# Patient Record
Sex: Female | Born: 1975 | Hispanic: Yes | Marital: Single | Smoking: Never smoker
Health system: Southern US, Community
[De-identification: ages and names within clinical notes are randomized; demographics above are authoritative.]

## PROBLEM LIST (undated history)

## (undated) DIAGNOSIS — E039 Hypothyroidism, unspecified: Secondary | ICD-10-CM

## (undated) DIAGNOSIS — E079 Disorder of thyroid, unspecified: Secondary | ICD-10-CM

---

## 2007-05-02 ENCOUNTER — Ambulatory Visit: Payer: Self-pay

## 2008-06-14 ENCOUNTER — Inpatient Hospital Stay: Payer: Self-pay | Admitting: Internal Medicine

## 2012-12-05 DIAGNOSIS — E039 Hypothyroidism, unspecified: Secondary | ICD-10-CM | POA: Insufficient documentation

## 2013-12-06 DIAGNOSIS — IMO0002 Reserved for concepts with insufficient information to code with codable children: Secondary | ICD-10-CM | POA: Insufficient documentation

## 2013-12-06 DIAGNOSIS — O09529 Supervision of elderly multigravida, unspecified trimester: Secondary | ICD-10-CM | POA: Insufficient documentation

## 2013-12-13 DIAGNOSIS — O09299 Supervision of pregnancy with other poor reproductive or obstetric history, unspecified trimester: Secondary | ICD-10-CM

## 2013-12-13 HISTORY — DX: Supervision of pregnancy with other poor reproductive or obstetric history, unspecified trimester: O09.299

## 2020-03-30 ENCOUNTER — Other Ambulatory Visit: Payer: Self-pay

## 2020-03-30 ENCOUNTER — Emergency Department
Admission: EM | Admit: 2020-03-30 | Discharge: 2020-03-30 | Disposition: A | Discharge status: Home / Self Care | Payer: Self-pay | Attending: Emergency Medicine | Admitting: Emergency Medicine

## 2020-03-30 ENCOUNTER — Emergency Department: Payer: Self-pay

## 2020-03-30 ENCOUNTER — Encounter: Payer: Self-pay | Admitting: Emergency Medicine

## 2020-03-30 DIAGNOSIS — K5792 Diverticulitis of intestine, part unspecified, without perforation or abscess without bleeding: Secondary | ICD-10-CM | POA: Insufficient documentation

## 2020-03-30 HISTORY — DX: Disorder of thyroid, unspecified: E07.9

## 2020-03-30 LAB — BASIC METABOLIC PANEL
Anion gap: 10 (ref 5–15)
BUN: 8 mg/dL (ref 6–20)
CO2: 25 mmol/L (ref 22–32)
Calcium: 8.6 mg/dL — ABNORMAL LOW (ref 8.9–10.3)
Chloride: 103 mmol/L (ref 98–111)
Creatinine, Ser: 0.58 mg/dL (ref 0.44–1.00)
GFR calc Af Amer: >60 mL/min (ref >60)
GFR calc non Af Amer: >60 mL/min (ref >60)
Glucose, Bld: 101 mg/dL — ABNORMAL HIGH (ref 70–99)
Potassium: 3.5 mmol/L (ref 3.5–5.1)
Sodium: 138 mmol/L (ref 135–145)

## 2020-03-30 LAB — URINALYSIS, COMPLETE (UACMP) WITH MICROSCOPIC
Bilirubin Urine: NEGATIVE
Glucose, UA: NEGATIVE mg/dL
Hgb urine dipstick: NEGATIVE
Ketones, ur: NEGATIVE mg/dL
Leukocytes,Ua: NEGATIVE
Nitrite: NEGATIVE
Protein, ur: NEGATIVE mg/dL
Specific Gravity, Urine: 1.01 (ref 1.005–1.030)
pH: 7 (ref 5.0–8.0)

## 2020-03-30 LAB — POCT PREGNANCY, URINE: Preg Test, Ur: NEGATIVE

## 2020-03-30 LAB — CBC
HCT: 36.8 % (ref 36.0–46.0)
Hemoglobin: 13.3 g/dL (ref 12.0–15.0)
MCH: 31.3 pg (ref 26.0–34.0)
MCHC: 36.1 g/dL — ABNORMAL HIGH (ref 30.0–36.0)
MCV: 86.6 fL (ref 80.0–100.0)
Platelets: 328 10*3/uL (ref 150–400)
RBC: 4.25 MIL/uL (ref 3.87–5.11)
RDW: 12.9 % (ref 11.5–15.5)
WBC: 13.3 10*3/uL — ABNORMAL HIGH (ref 4.0–10.5)
nRBC: 0 % (ref 0.0–0.2)

## 2020-03-30 MED ORDER — HYDROCODONE-ACETAMINOPHEN 5-325 MG PO TABS: 1.0000 | ORAL_TABLET | ORAL | 0 refills | Status: DC | PRN

## 2020-03-30 MED ORDER — AMOXICILLIN-POT CLAVULANATE 875-125 MG PO TABS: 1.0000 | ORAL_TABLET | Freq: Two times a day (BID) | ORAL | 0 refills | Status: AC

## 2020-03-30 NOTE — ED Triage Notes (Signed)
Left flank and left side pain since last night. No NVD. No fever or hematuria. Denies hx of kidney stones.  Ambulatory, VSS

## 2020-03-30 NOTE — ED Provider Notes (Signed)
[facility] Emergency Department Provider Note  Time seen: 12:18 PM  I have reviewed the triage vital signs and the nursing notes.   HISTORY  Chief Complaint Abdominal Pain   HPI Emily Ellison is a 44 y.o. female with no significant past medical history presents emergency department for left flank pain.  According to the patient since last night she has been experiencing sharp 8/10 pain in her left side.  Denies any nausea vomiting diarrhea does state urinary frequency but denies dysuria or hematuria.  Denies any recent illnesses, largely negative review of systems.   Past Medical History:  Diagnosis Date  . Thyroid disease     There are no problems to display for this patient.   History reviewed. No pertinent surgical history.  Prior to Admission medications   Not on File    No Known Allergies  History reviewed. No pertinent family history.  Social History Social History   Tobacco Use  . Smoking status: Never Smoker  . Smokeless tobacco: Never Used  Substance Use Topics  . Alcohol use: Never  . Drug use: Never    Review of Systems Constitutional: Negative for fever. Cardiovascular: Negative for chest pain. Respiratory: Negative for shortness of breath. Gastrointestinal: Moderate left flank pain.  Negative for nausea vomiting diarrhea Genitourinary: Negative for urinary compaints Musculoskeletal: Negative for musculoskeletal complaints Neurological: Negative for headache All other ROS negative  ____________________________________________   PHYSICAL EXAM:  VITAL SIGNS: ED Triage Vitals  Enc Vitals Group     BP 03/30/20 1134 109/61     Pulse Rate 03/30/20 1134 77     Resp 03/30/20 1134 16     Temp 03/30/20 1134 98.5 F (36.9 C)     Temp Source 03/30/20 1134 Oral     SpO2 03/30/20 1134 96 %     Weight 03/30/20 1131 154 lb (69.9 kg)     Height 03/30/20 1131 5\' 5"  (1.651 m)     Head Circumference --      Peak Flow --       Pain Score 03/30/20 1131 8     Pain Loc --      Pain Edu? --      Excl. in GC? --    Constitutional: Alert and oriented. Well appearing and in no distress. Eyes: Normal exam ENT      Head: Normocephalic and atraumatic.      Mouth/Throat: Mucous membranes are moist. Cardiovascular: Normal rate, regular rhythm. Respiratory: Normal respiratory effort without tachypnea nor retractions. Breath sounds are clear  Gastrointestinal: Soft, mild left upper and left lower quadrant tenderness palpation without rebound guarding or distention. Musculoskeletal: Nontender with normal range of motion in all extremities.  Neurologic:  Normal speech and language. No gross focal neurologic deficits  Skin:  Skin is warm, dry and intact.  Psychiatric: Mood and affect are normal.  ____________________________________________   RADIOLOGY  CT shows diverticulitis with possible contained perforation.  ____________________________________________   INITIAL IMPRESSION / ASSESSMENT AND PLAN / ED COURSE  Pertinent labs & imaging results that were available during my care of the patient were reviewed by me and considered in my medical decision making (see chart for details).   Patient presents emergency department for left flank pain since yesterday currently rates as 8/10 sharp pain.  No history of kidney stones.  Patient has mild tenderness palpation on the left side only without rebound guarding or distention.  Patient's lab work including urinalysis is largely within normal limits, slight  leukocytosis.  Pregnancy test is negative.  Differential would include colitis, diverticulitis, UTI or pyelonephritis, ureterolithiasis.  We will obtain a CT scan left flank to further evaluate.  Patient does not wish for any pain medication at this time.  CT scan shows diverticulitis with a possible small contained perforation but no sign of abscess. I spoke to Dr. Lady Gary of surgery who has reviewed the CT images and  believe the patient safe for discharge home from her standpoint. We will start the patient on Augmentin for the next 2 weeks. She did request the patient follow-up with her in the office in approximately 1 week. I discussed return precautions. Patient agreeable plan of care.  Emily Ellison was evaluated in Emergency Department on 03/30/2020 for the symptoms described in the history of present illness. She was evaluated in the context of the global COVID-19 pandemic, which necessitated consideration that the patient might be at risk for infection with the SARS-CoV-2 virus that causes COVID-19. Institutional protocols and algorithms that pertain to the evaluation of patients at risk for COVID-19 are in a state of rapid change based on information released by regulatory bodies including the CDC and federal and state organizations. These policies and algorithms were followed during the patient's care in the ED.  ____________________________________________   FINAL CLINICAL IMPRESSION(S) / ED DIAGNOSES  Left flank pain diverticulitis   Minna Antis, MD 03/30/20 1420

## 2020-04-23 ENCOUNTER — Ambulatory Visit: Payer: Self-pay | Admitting: General Surgery

## 2020-04-23 ENCOUNTER — Other Ambulatory Visit: Payer: Self-pay

## 2020-04-23 ENCOUNTER — Encounter: Payer: Self-pay | Admitting: General Surgery

## 2020-04-23 VITALS — BP 121/85 | HR 65 | Temp 99.0°F | Resp 12 | Ht 60.0 in | Wt 151.0 lb

## 2020-04-23 DIAGNOSIS — K5732 Diverticulitis of large intestine without perforation or abscess without bleeding: Secondary | ICD-10-CM

## 2020-04-23 DIAGNOSIS — R1084 Generalized abdominal pain: Secondary | ICD-10-CM

## 2020-04-23 NOTE — Progress Notes (Signed)
Patient ID: Emily Ellison Ellison, female   DOB: [birth year], 44 y.o.   MRN: [id number]  Chief Complaint  Patient presents with  . Follow-up    diverticulitis with perforation    HPI Emily Ellison Ellison is a 44 y.o. female.   She is here today for follow-up from the emergency room, where she presented with left flank pain on March 30, 2020.  She underwent a renal stone protocol CT scan that did not show any evidence of nephrolithiasis.  It did, however, show diverticulitis with a possible small contained perforation.  As her hemodynamics were normal and her pain was improving, she was discharged from the emergency department on an oral course of antibiotics.  She is here today for follow-up and to discuss any further management necessary.  Today's interview was held with the assistance of a Spanish language interpreter.  Today, she reports that she is not experiencing any abdominal pain.  She denies any fevers or chills.  No nausea or vomiting.  Bowel movements are regular.  She has a good appetite and she completed her course of antibiotics without difficulty.  She does report, that since her emergency department visit, she has had some bloating and difficulty passing flatus.  She also reports that when she has this kind of discomfort, she is relieved by taking a medication that she brought with her today.  On inspection, it appears to be simethicone-like compound.  She states that she has also had issues with belching and stomach bloating for a number of years, prior to her ER presentation.  This was the first episode of diverticulitis that she has experienced.   Past Medical History:  Diagnosis Date  . Thyroid disease     History reviewed. No pertinent surgical history.  Family History  Problem Relation Age of Onset  . Hypertension Mother   . Hyperlipidemia Mother   . Ovarian cancer Mother   . Hyperlipidemia Father   . Hypertension Father     Social History Social History    Tobacco Use  . Smoking status: Never Smoker  . Smokeless tobacco: Never Used  Substance Use Topics  . Alcohol use: Never  . Drug use: Never    No Known Allergies  Current Outpatient Medications  Medication Sig Dispense Refill  . levothyroxine (SYNTHROID) 88 MCG tablet Take by mouth.     No current facility-administered medications for this visit.    Review of Systems Review of Systems  All other systems reviewed and are negative. Or as discussed in the history of present illness.  Blood pressure 121/85, pulse 65, temperature 99 F (37.2 C), temperature source Oral, resp. rate 12, height 5' (1.524 m), weight 151 lb (68.5 kg), last menstrual period 04/04/2020, SpO2 97 %. Body mass index is 29.49 kg/m.  Physical Exam Physical Exam Constitutional:      General: She is not in acute distress.    Appearance: She is obese.  HENT:     Head: Normocephalic and atraumatic.     Nose:     Comments: Covered with a mask    Mouth/Throat:     Comments: Covered with a mask Eyes:     General: No scleral icterus.       Right eye: No discharge.        Left eye: No discharge.  Neck:     Comments: No palpable cervical or supraclavicular lymphadenopathy.  The trachea is midline.  No gross thyromegaly or dominant thyroid masses appreciated.  The gland moves freely  with deglutition. Cardiovascular:     Rate and Rhythm: Normal rate and regular rhythm.  Pulmonary:     Effort: Pulmonary effort is normal.     Breath sounds: Normal breath sounds.  Abdominal:     General: Bowel sounds are normal.     Palpations: Abdomen is soft.     Tenderness: There is no guarding or rebound.     Comments: She has some mild discomfort to deep palpation in the left upper quadrant.  She says that she feels the pressure and discomfort radiate down towards the suprapubic area.  Genitourinary:    Comments: Deferred Musculoskeletal:        General: No swelling or tenderness.  Skin:    General: Skin is warm  and dry.  Neurological:     General: No focal deficit present.     Mental Status: She is alert and oriented to person, place, and time.  Psychiatric:        Mood and Affect: Mood normal.        Behavior: Behavior normal.     Data Reviewed I reviewed the labs and the imaging that was obtained during her visit to the emergency department.  Results for Emily Ellison, Ellison (MRN [id number]) as of 04/23/2020 13:28  Ref. Range 03/30/2020 11:35  Sodium Latest Ref Range: 135 - 145 mmol/L 138  Potassium Latest Ref Range: 3.5 - 5.1 mmol/L 3.5  Chloride Latest Ref Range: 98 - 111 mmol/L 103  CO2 Latest Ref Range: 22 - 32 mmol/L 25  Glucose Latest Ref Range: 70 - 99 mg/dL 630 (H)  BUN Latest Ref Range: 6 - 20 mg/dL 8  Creatinine Latest Ref Range: 0.44 - 1.00 mg/dL 1.60  Calcium Latest Ref Range: 8.9 - 10.3 mg/dL 8.6 (L)  Anion gap Latest Ref Range: 5 - 15  10  GFR, Est Non African American Latest Ref Range: >60 mL/min >60  GFR, Est African American Latest Ref Range: >60 mL/min >60  WBC Latest Ref Range: 4.0 - 10.5 K/uL 13.3 (H)  RBC Latest Ref Range: 3.87 - 5.11 MIL/uL 4.25  Hemoglobin Latest Ref Range: 12.0 - 15.0 g/dL 10.9  HCT Latest Ref Range: 36 - 46 % 36.8  MCV Latest Ref Range: 80.0 - 100.0 fL 86.6  MCH Latest Ref Range: 26.0 - 34.0 pg 31.3  MCHC Latest Ref Range: 30.0 - 36.0 g/dL 32.3 (H)  RDW Latest Ref Range: 11.5 - 15.5 % 12.9  Platelets Latest Ref Range: 150 - 400 K/uL 328  nRBC Latest Ref Range: 0.0 - 0.2 % 0.0  Appearance Latest Ref Range: CLEAR  CLEAR (A)  Bilirubin Urine Latest Ref Range: NEGATIVE  NEGATIVE  Color, Urine Latest Ref Range: YELLOW  YELLOW (A)  Glucose, UA Latest Ref Range: NEGATIVE mg/dL NEGATIVE  Hgb urine dipstick Latest Ref Range: NEGATIVE  NEGATIVE  Ketones, ur Latest Ref Range: NEGATIVE mg/dL NEGATIVE  Leukocytes,Ua Latest Ref Range: NEGATIVE  NEGATIVE  Nitrite Latest Ref Range: NEGATIVE  NEGATIVE  pH Latest Ref Range: 5.0 - 8.0  7.0  Protein Latest  Ref Range: NEGATIVE mg/dL NEGATIVE  Specific Gravity, Urine Latest Ref Range: 1.005 - 1.030  1.010  Bacteria, UA Latest Ref Range: NONE SEEN  RARE (A)  Mucus Unknown PRESENT  RBC / HPF Latest Ref Range: 0 - 5 RBC/hpf 0-5  Squamous Epithelial / LPF Latest Ref Range: 0 - 5  0-5  WBC, UA Latest Ref Range: 0 - 5 WBC/hpf 0-5  These labs show a mild  leukocytosis without any other significant abnormalities.  I personally reviewed the CT imaging.  I concur with the radiologist interpretation which is copied here:  CLINICAL DATA:  Left flank and left side pain since last night. No NVD. No fever or hematuria. Denies hx of kidney stones. Neg preg.  EXAM: CT ABDOMEN AND PELVIS WITHOUT CONTRAST  TECHNIQUE: Multidetector CT imaging of the abdomen and pelvis was performed following the standard protocol without IV contrast.  COMPARISON:  None.  FINDINGS: Lower chest: No acute abnormality.  Evaluation of the abdominal viscera somewhat limited by the lack IV contrast.  Hepatobiliary: No focal liver abnormality is seen. No gallstones, gallbladder wall thickening, or biliary dilatation.  Pancreas: Unremarkable. No surrounding inflammatory changes.  Spleen: Normal in size without focal abnormality.  Adrenals/Urinary Tract: Adrenal glands are unremarkable. There is a nonobstructing 2 mm left kidney stone. No right renal calculi. No hydronephrosis. Urinary bladder unremarkable.  Stomach/Bowel: Stomach is within normal limits. Appendix appears normal. There are multiple colonic diverticula. Along the descending colon in the left abdomen there is focal bowel wall thickening and pericolonic fat stranding. There is no evidence of abscess however there may be a tiny contained perforation (series 2, image 46). No evidence of free air. Remainder of the bowel is unremarkable.  Vascular/Lymphatic: No significant vascular findings are present. No enlarged abdominal or pelvic lymph  nodes.  Reproductive: Uterus and bilateral adnexa are unremarkable.  Other: No abdominal wall hernia or abnormality. No abdominopelvic ascites.  Musculoskeletal: No acute or significant osseous findings.  IMPRESSION: 1. Acute diverticulitis of the descending colon. There is no evidence of abscess however there may be a tiny contained perforation. No evidence of distant free air. 2. Nonobstructing 2 mm left kidney stone.  Assessment This is a 44 year old woman who presented to the emergency department in September with left flank pain.  She had a 2 mm left kidney stone on imaging, but more significantly, she appeared to have diverticulitis with a tiny contained perforation.  She was treated with antibiotics and has done well.  She still has some mild left upper quadrant discomfort that radiates with pressure down to the suprapubic area.  She also complains of difficulty passing flatus, as well as some upper epigastric bloating and belching.  Plan To better evaluate the extent of her disease, I would like to have a CT scan of the abdomen and pelvis performed with oral and IV contrast.  If there is no further evidence of perforation, she should undergo colonoscopy and potentially upper endoscopy to better evaluate the entire colonic lumen, as well as to assess for any potential causes for her bloating and belching.  As this was her first episode of diverticulitis, I discussed with her that she did not necessarily need to undergo surgical resection, but that if she had more frequent or more severe bouts, it is something we would certainly need to consider.  I will communicate with her after we have the results of the CT scan.  A referral has been made to gastroenterology.  Follow-up with me will be determined once I have more data.    Duanne Guess 04/23/2020, 1:26 PM

## 2020-04-23 NOTE — Patient Instructions (Addendum)
Avoid constipation. CT scan scheduled 05/04/20 @ 9am at [facility]. Please enter through the [place]. Please do not eat or drink 4 hours prior.  You will need to pick up your prep kit today @ [facility] [place] [address]. Dr.Cannon will call you with your CT scan results. We sent the referral to [place]. Someone from their office will call to schedule an appointment.  Contenido de [place] de los alimentos (Cardinal Health Content in Foods) Consulte la lista a continuacin para Armed forces logistics/support/administrative officer contenido de fibra de algunos alimentos que se consumen con frecuencia. ALIMENTOS CON ALTO CONTENIDO DE FIBRA Los alimentos ricos en fibra contienen 4gramos o ms de fibra por porcin. Estos incluyen los siguientes:  Alcachofa (fresca): 46mediana tiene 10,3g de [place].  Frijoles cocidos, comunes o vegetarianos (en lata), taza tiene 5,2g de fibra.  Arndanos o frambuesas (frescas): taza tiene 4g de fibra.  Cereal de salvado: taza tiene 8,6g de fibra.  Trigo burgol (cocido): taza tiene 4g de [place].  Frijoles (en lata): taza tiene 6,8g de fibra.  Lentejas (cocidas): taza tiene 7,8g de fibra.  Pera (fresca): 67mediana tiene 5,1g de [place].  Guisantes (congelados): taza tiene 4,4g de [place].  Frijoles pintos (en lata): taza tiene 5,5g de fibra.  Frijoles pintos (secos y cocidos): taza tiene 7,7g de [place].  Papa con piel (al horno): 9mediana tiene 4,4g de [place].  Quinua (cocida): taza tiene 5g de fibra.  Porotos de soja (en lata, congelados o frescos): taza tiene 5,1g de fibra. ALIMENTOS CON CONTENIDO MODERADO DE FIBRA Los alimentos con contenido moderado de [place] de 1 a 4gramos de fibra por porcin. Estos incluyen los siguientes:  Almendras: 1onza tiene 3,5g de [place].  Manzana con piel: 42mediana tiene 3,3g de fibra.  Pur de Firefighter (endulzado): taza tiene 1,5g de Pharmacist, hospital.  Bagel solo: un bagel de 4pulgadas  (10cm) tiene 2g de fibra.  Banana: 91mediana tiene 3,1g de fibra.  Brcoli (cocido): taza tiene 2,5g de [place].  Zanahorias (cocidas): taza tiene 2,3g de [place].  Maz (en lata o congelado): taza tiene 2,1g de [place].  Tortilla de maz: una tortilla de 6pulgadas (15cm) tiene 1,5g de [place].  Judas verdes (en lata): taza tiene 2g de fibra.  Avena instantnea: taza tiene aproximadamente 2g de fibra.  Arroz integral de Set designer (cocido): 1taza tiene 3,5g de Pharmacist, hospital.  Macarrones enriquecidos (cocidos): 1 taza tiene 2,5g de [place].  Meln: 1taza tiene 1,4g de fibra.  Multicereales: taza tiene aproximadamente 2 a 4g de fibra.  Naranja: 1pequea tiene 3,1g de fibra.  Pur de papas: taza tiene 1,6g de fibra.  Pasas: taza tiene 1,6g de fibra.  Calabaza: taza tiene 2,9g de [place].  Semillas de girasol: taza tiene 1,1g de [place].  Tomate: 8mediano tiene 1,5g de fibra.  Hamburguesa de vegetales o de soja: 1 tiene 3,4g de [place].  Pan de salvado: 1rebanada tiene 2g de [place].  Espaguetis de salvado: taza tiene 3,2g de fibra. ALIMENTOS CON BAJO CONTENIDO DE FIBRA Los alimentos con bajo contenido de [place] tienen menos de 1gramo de fibra por porcin. Estos incluyen los siguientes:  Huevo: 1grande.  Tortilla de harina: una tortilla de 6pulgadas (15cm).  Jugo de fruta: taza.  Valeda Malm[place]aren Counter.  Carne de res, ave o pescado: 1onza.  Leche: 1taza.  Espinaca (cruda): 1taza.  Pan blanco: 1rebanada.  Arroz blanco: taza.  Yogur: taza. Las cantidades reales de fibra de los alimentos pueden ser diferentes en funcin del procesamiento. Hable con el nutricionista sobre la cantidad de [place]  que necesita en la dieta. Esta informacin no tiene Marine scientist el consejo del mdico. Asegrese de hacerle al mdico cualquier pregunta que tenga. Document Revised: 07/28/2016 Document Reviewed: 08/20/2015 Elsevier Patient  Education  [place].    Diverticulitis  La diverticulitis ocurre cuando pequeos bolsillos que se han formado en el intestino grueso (colon) se infectan o se inflaman. Esto produce dolor de Paramedic y heces lquidas (diarrea). Estas bolsas en el colon se denominan divertculos. Se forman en las personas que tienen una afeccin llamada diverticulitis. Siga estas indicaciones en su casa: Medicamentos  [employer] de venta libre y los recetados solamente como se lo haya indicado el mdico. Estos incluyen los siguientes: ? Antibiticos. ? Analgsicos. ? Pastillas de [place]. ? Probiticos. ? Laxantes.  No conduzca ni use maquinaria pesada mientras toma analgsicos recetados.  Si le recetaron un antibitico, tmelo como se lo hayan indicado. No deje de tomarlos aunque se sienta mejor. Instrucciones generales   Siga la dieta como se lo haya indicado el mdico.  Cuando se sienta mejor, el mdico puede indicarle que cambie la dieta. Tal vez necesite ingerir gran cantidad de fibra. La fibra facilita la evacuacin intestinal (defecacin). Entre los alimentos saludables con [place], se incluyen los siguientes: ? Frutos rojos. ? Frijoles. ? Lentejas. ? Verduras de [employer].  Haga ejercicios 3 o ms veces por semana. Hgalos durante 30 minutos cada vez. Ejerctese lo suficiente como para transpirar y Conservation officer, historic buildings los latidos cardacos.  Concurra a todas las visitas de control como se lo hayan indicado. Esto es importante. Puede que tenga que someterse a un examen del intestino grueso. Esto se denomina colonoscopia. Comunquese con un mdico si:  El dolor no mejora.  Le cuesta mucho comer o beber.  No defeca como lo hace normalmente. Solicite ayuda de inmediato si:  El [place] representative.  Los problemas no mejoran.  Los problemas empeoran muy rpidamente.  Tiene fiebre.  Devuelve (vomita) ms de una vez.  Sus heces tienen las siguientes caractersticas: ? Orthoptist. ? Son de color negro. ? Son alquitranadas. Resumen  La diverticulitis ocurre cuando pequeos bolsillos que se han formado en el intestino grueso (colon) se infectan o se inflaman.  Tome los medicamentos solamente como se lo haya indicado el mdico.  Siga la dieta como se lo haya indicado el mdico. Esta informacin no tiene Marine scientist el consejo del mdico. Asegrese de hacerle al mdico cualquier pregunta que tenga. Document Revised: 12/29/2016 Document Reviewed: 12/29/2016 Elsevier Patient Education  [place].

## 2020-05-04 ENCOUNTER — Other Ambulatory Visit: Payer: Self-pay

## 2020-05-04 ENCOUNTER — Ambulatory Visit
Admission: RE | Admit: 2020-05-04 | Discharge: 2020-05-04 | Disposition: A | Discharge status: Home / Self Care | Payer: Self-pay | Source: Ambulatory Visit | Attending: General Surgery | Admitting: General Surgery

## 2020-05-04 DIAGNOSIS — R1084 Generalized abdominal pain: Secondary | ICD-10-CM | POA: Insufficient documentation

## 2020-05-04 MED ORDER — IOHEXOL 300 MG/ML  SOLN
100.0000 mL | Freq: Once | INTRAMUSCULAR | Status: AC | PRN
  Administered 2020-05-04: 100 mL via INTRAVENOUS

## 2020-05-08 ENCOUNTER — Telehealth (INDEPENDENT_AMBULATORY_CARE_PROVIDER_SITE_OTHER): Payer: Self-pay | Admitting: General Surgery

## 2020-05-08 NOTE — Telephone Encounter (Signed)
Using telephone interpreter services, I discussed the results of her CT scan with the patient.  Diverticulitis totally resolved.  No need for surgery at this time. Should keep GI appointment for Monday. No need for follow up with me, but can call if any additional concerns arise.

## 2020-05-11 ENCOUNTER — Ambulatory Visit (INDEPENDENT_AMBULATORY_CARE_PROVIDER_SITE_OTHER): Payer: Self-pay | Admitting: Gastroenterology

## 2020-05-11 ENCOUNTER — Other Ambulatory Visit: Payer: Self-pay

## 2020-05-11 ENCOUNTER — Encounter: Payer: Self-pay | Admitting: Gastroenterology

## 2020-05-11 VITALS — BP 121/75 | HR 85 | Temp 98.3°F | Ht 60.0 in | Wt 152.1 lb

## 2020-05-11 DIAGNOSIS — K5732 Diverticulitis of large intestine without perforation or abscess without bleeding: Secondary | ICD-10-CM

## 2020-05-11 DIAGNOSIS — R1013 Epigastric pain: Secondary | ICD-10-CM

## 2020-05-11 NOTE — Progress Notes (Signed)
Arlyss Repress, MD [address]  [address]  [place], [place] [address]  Main: [phone]  Fax: [phone]    Gastroenterology Consultation  Referring Provider:     Duanne Guess, MD Primary Care Physician:  Inc, [facility] Primary Gastroenterologist:  Dr. Arlyss Repress Reason for Consultation:     History of diverticulitis        HPI:   Emily Ellison is a 44 y.o. female referred by Dr. Theodoro Doing, [facility]  for consultation & management of recent episode of acute diverticulitis.  Patient went to [facility] ER on 03/30/2020 secondary to left lower quadrant pain.  She underwent CT renal protocol which revealed thickening of the descending colon consistent with acute descending colon uncomplicated diverticulitis.  She also had mild leukocytosis.  She was discharged home on 10-day course of Augmentin.  Subsequently, she was followed by Dr. Duanne Guess, general surgeon.  She underwent repeat CT which revealed resolution of diverticulitis.  She was referred to me to discuss about colonoscopy.  Patient reports that she was experiencing 4-5 loose bowel movements when she had an episode of diverticulitis.  After treatment with antibiotics, currently she is having 2 soft bowel movements daily.  Her main concern is significant gas and bloating, burping.  She is taking simethicone as needed.  She is avoiding lactose, sodas and red meat.  Her weight has been stable No evidence of anemia.  She does not smoke or drink alcohol  NSAIDs: None  Antiplts/Anticoagulants/Anti thrombotics: None  GI Procedures: None She denies family history of known GI malignancy  Past Medical History:  Diagnosis Date  . Thyroid disease     History reviewed. No pertinent surgical history.   Current Outpatient Medications:  .  levothyroxine (SYNTHROID) 88 MCG tablet, Take by mouth., Disp: , Rfl:    Family History  Problem Relation Age of Onset  . Hypertension Mother   .  Hyperlipidemia Mother   . Ovarian cancer Mother   . Hyperlipidemia Father   . Hypertension Father      Social History   Tobacco Use  . Smoking status: Never Smoker  . Smokeless tobacco: Never Used  Substance Use Topics  . Alcohol use: Never  . Drug use: Never    Allergies as of 05/11/2020  . (No Known Allergies)    Review of Systems:    All systems reviewed and negative except where noted in HPI.   Physical Exam:  BP 121/75 (BP Location: Left Arm, Patient Position: Sitting, Cuff Size: Normal)   Pulse 85   Temp 98.3 F (36.8 C) (Oral)   Ht 5' (1.524 m)   Wt 152 lb 2 oz (69 kg)   LMP 05/01/2020   BMI 29.71 kg/m  Patient's last menstrual period was 05/01/2020.  General:   Alert,  Well-developed, well-nourished, pleasant and cooperative in NAD Head:  Normocephalic and atraumatic. Eyes:  Sclera clear, no icterus.   Conjunctiva pink. Ears:  Normal auditory acuity. Nose:  No deformity, discharge, or lesions. Mouth:  No deformity or lesions,oropharynx pink & moist. Neck:  Supple; no masses or thyromegaly. Lungs:  Respirations even and unlabored.  Clear throughout to auscultation.   No wheezes, crackles, or rhonchi. No acute distress. Heart:  Regular rate and rhythm; no murmurs, clicks, rubs, or gallops. Abdomen:  Normal bowel sounds. Soft, non-tender and mildly distended, tympanic to percussion without masses, hepatosplenomegaly or hernias noted.  No guarding or rebound tenderness.   Rectal: Not performed Msk:  Symmetrical without gross deformities. Good, equal movement & strength bilaterally. Pulses:  Normal pulses noted. Extremities:  No clubbing or edema.  No cyanosis. Neurologic:  Alert and oriented x3;  grossly normal neurologically. Skin:  Intact without significant lesions or rashes. No jaundice. Lymph Nodes:  No significant cervical adenopathy. Psych:  Alert and cooperative. Normal mood and affect.  Imaging Studies: Reviewed  Assessment and Plan:   Emily Ellison is a 44 y.o. Spanish-speaking female with history of hypothyroidism, recent episode of acute uncomplicated left-sided diverticulitis s/p 10 days course of Augmentin is seen for follow-up  Recommend diagnostic colonoscopy given age and recent attack of diverticulitis, evaluate for neoplasm  Abdominal bloating, gas and burping Recommend H. pylori breath test and treat if positive given patient's ethnicity   Follow up based on above work-up   Arlyss Repress, MD

## 2020-05-12 LAB — H. PYLORI BREATH TEST: H pylori Breath Test: NEGATIVE

## 2020-05-13 ENCOUNTER — Telehealth: Payer: Self-pay

## 2020-05-13 NOTE — Telephone Encounter (Signed)
-----   Message from Toney Reil, MD sent at 05/13/2020  9:15 AM EDT ----- H. pylori breath test came back negative, I will see her for colonoscopy as scheduledRohini Vanga

## 2020-05-13 NOTE — Telephone Encounter (Signed)
Used interpreter services and patient verbalized understanding  

## 2020-05-15 ENCOUNTER — Other Ambulatory Visit: Payer: Self-pay

## 2020-05-15 ENCOUNTER — Other Ambulatory Visit
Admission: RE | Admit: 2020-05-15 | Discharge: 2020-05-15 | Disposition: A | Discharge status: Home / Self Care | Payer: Self-pay | Source: Ambulatory Visit | Attending: Gastroenterology | Admitting: Gastroenterology

## 2020-05-15 DIAGNOSIS — Z01812 Encounter for preprocedural laboratory examination: Secondary | ICD-10-CM | POA: Insufficient documentation

## 2020-05-15 DIAGNOSIS — Z20822 Contact with and (suspected) exposure to covid-19: Secondary | ICD-10-CM | POA: Insufficient documentation

## 2020-05-15 LAB — SARS CORONAVIRUS 2 (TAT 6-24 HRS): SARS Coronavirus 2: NEGATIVE

## 2020-05-18 ENCOUNTER — Encounter: Payer: Self-pay | Admitting: Gastroenterology

## 2020-05-19 ENCOUNTER — Ambulatory Visit: Payer: Self-pay | Admitting: Certified Registered"

## 2020-05-19 ENCOUNTER — Ambulatory Visit
Admission: RE | Admit: 2020-05-19 | Discharge: 2020-05-19 | Disposition: A | Discharge status: Home / Self Care | Payer: Self-pay | Attending: Gastroenterology | Admitting: Gastroenterology

## 2020-05-19 ENCOUNTER — Encounter
Admission: RE | Disposition: A | Discharge status: Home / Self Care | Payer: Self-pay | Source: Home / Self Care | Attending: Gastroenterology

## 2020-05-19 ENCOUNTER — Other Ambulatory Visit: Payer: Self-pay

## 2020-05-19 ENCOUNTER — Encounter: Payer: Self-pay | Admitting: Gastroenterology

## 2020-05-19 DIAGNOSIS — K644 Residual hemorrhoidal skin tags: Secondary | ICD-10-CM | POA: Insufficient documentation

## 2020-05-19 DIAGNOSIS — Z09 Encounter for follow-up examination after completed treatment for conditions other than malignant neoplasm: Secondary | ICD-10-CM | POA: Insufficient documentation

## 2020-05-19 DIAGNOSIS — K5730 Diverticulosis of large intestine without perforation or abscess without bleeding: Secondary | ICD-10-CM | POA: Insufficient documentation

## 2020-05-19 DIAGNOSIS — K5732 Diverticulitis of large intestine without perforation or abscess without bleeding: Secondary | ICD-10-CM

## 2020-05-19 HISTORY — PX: COLONOSCOPY WITH PROPOFOL: SHX5780

## 2020-05-19 HISTORY — DX: Hypothyroidism, unspecified: E03.9

## 2020-05-19 LAB — POCT PREGNANCY, URINE: Preg Test, Ur: NEGATIVE

## 2020-05-19 SURGERY — COLONOSCOPY WITH PROPOFOL
Anesthesia: General

## 2020-05-19 MED ORDER — PROPOFOL 10 MG/ML IV BOLUS
INTRAVENOUS | Status: AC
  Filled 2020-05-19: qty 20

## 2020-05-19 MED ORDER — SODIUM CHLORIDE 0.9 % IV SOLN: INTRAVENOUS | Status: DC

## 2020-05-19 MED ORDER — LIDOCAINE HCL (PF) 2 % IJ SOLN
INTRAMUSCULAR | Status: AC
  Filled 2020-05-19: qty 5

## 2020-05-19 MED ORDER — PROPOFOL 500 MG/50ML IV EMUL
INTRAVENOUS | Status: DC | PRN
  Administered 2020-05-19: 111 ug/kg/min via INTRAVENOUS

## 2020-05-19 MED ORDER — PROPOFOL 500 MG/50ML IV EMUL
INTRAVENOUS | Status: AC
  Filled 2020-05-19: qty 50

## 2020-05-19 MED ORDER — LIDOCAINE HCL (CARDIAC) PF 100 MG/5ML IV SOSY
PREFILLED_SYRINGE | INTRAVENOUS | Status: DC | PRN
  Administered 2020-05-19: 50 mg via INTRAVENOUS

## 2020-05-19 NOTE — Op Note (Signed)
[facility] Gastroenterology Patient Name: Emily Ellison Procedure Date: 05/19/2020 12:06 PM MRN: [id number] Account #: [id number] Date of Birth: Dec 26, 1975 Admit Type: Outpatient Age: 44 Room: [facility] ENDO ROOM 3 Gender: Female Note Status: Finalized Procedure:             Colonoscopy Indications:           This is the patient's first colonoscopy, Follow-up of                         diverticulitis Providers:             Toney Reil MD, MD Referring MD:          No Local Md, MD (Referring MD) Medicines:             General Anesthesia Complications:         No immediate complications. Estimated blood loss: None. Procedure:             Pre-Anesthesia Assessment:                        - Prior to the procedure, a History and Physical was                         performed, and patient medications and allergies were                         reviewed. The patient is competent. The risks and                         benefits of the procedure and the sedation options and                         risks were discussed with the patient. All questions                         were answered and informed consent was obtained.                         Patient identification and proposed procedure were                         verified by the physician, the nurse, the                         anesthesiologist, the anesthetist and the technician                         in the pre-procedure area in the procedure room in the                         endoscopy suite. Mental Status Examination: alert and                         oriented. Airway Examination: normal oropharyngeal                         airway and neck mobility. Respiratory Examination:  clear to auscultation. CV Examination: normal.                         Prophylactic Antibiotics: The patient does not require                         prophylactic antibiotics. Prior Anticoagulants: The                          patient has taken no previous anticoagulant or                         antiplatelet agents. ASA Grade Assessment: II - A                         patient with mild systemic disease. After reviewing                         the risks and benefits, the patient was deemed in                         satisfactory condition to undergo the procedure. The                         anesthesia plan was to use general anesthesia.                         Immediately prior to administration of medications,                         the patient was re-assessed for adequacy to receive                         sedatives. The heart rate, respiratory rate, oxygen                         saturations, blood pressure, adequacy of pulmonary                         ventilation, and response to care were monitored                         throughout the procedure. The physical status of the                         patient was re-assessed after the procedure.                        After obtaining informed consent, the colonoscope was                         passed under direct vision. Throughout the procedure,                         the patient's blood pressure, pulse, and oxygen                         saturations were monitored continuously. The  Colonoscope was introduced through the anus and                         advanced to the the terminal ileum, with                         identification of the appendiceal orifice and IC                         valve. The colonoscopy was performed without                         difficulty. The patient tolerated the procedure well.                         The quality of the bowel preparation was evaluated                         using the BBPS [facility] Bowel Preparation Scale) with                         scores of: Right Colon = 3, Transverse Colon = 3 and                         Left Colon = 3 (entire mucosa seen well with no                          residual staining, small fragments of stool or opaque                         liquid). The total BBPS score equals 9. Findings:      The perianal and digital rectal examinations were normal. Pertinent       negatives include normal sphincter tone and no palpable rectal lesions.      The terminal ileum appeared normal.      Multiple diverticula were found in the sigmoid colon.      Non-bleeding external hemorrhoids were found during retroflexion. The       hemorrhoids were medium-sized.      The exam was otherwise without abnormality. Impression:            - The examined portion of the ileum was normal.                        - Diverticulosis in the sigmoid colon.                        - Non-bleeding external hemorrhoids.                        - The examination was otherwise normal.                        - No specimens collected. Recommendation:        - Discharge patient to home (with escort).                        - Resume previous diet today.                        -  Continue present medications.                        - Repeat colonoscopy in 10 years for screening                         purposes. Procedure Code(s):     --- Professional ---                        [phone], Colonoscopy, flexible; diagnostic, including                         collection of specimen(s) by brushing or washing, when                         performed (separate procedure) Diagnosis Code(s):     --- Professional ---                        K64.4, Residual hemorrhoidal skin tags                        K57.32, Diverticulitis of large intestine without                         perforation or abscess without bleeding                        K57.30, Diverticulosis of large intestine without                         perforation or abscess without bleeding CPT copyright 2019 American Medical Association. All rights reserved. The codes documented in this report are preliminary and upon coder review may   be revised to meet current compliance requirements. Dr. Libby Maw Toney Reil MD, MD 05/19/2020 12:27:41 PM This report has been signed electronically. Number of Addenda: 0 Note Initiated On: 05/19/2020 12:06 PM Scope Withdrawal Time: 0 hours 4 minutes 53 seconds  Total Procedure Duration: 0 hours 9 minutes 0 seconds  Estimated Blood Loss:  Estimated blood loss: none.      [facility]

## 2020-05-19 NOTE — Anesthesia Preprocedure Evaluation (Signed)
Anesthesia Evaluation  Patient identified by MRN, date of birth, ID band Patient awake    Reviewed: Allergy & Precautions, NPO status , Patient's Chart, lab work & pertinent test results  Airway Mallampati: II       Dental no notable dental hx.    Pulmonary neg pulmonary ROS,    Pulmonary exam normal breath sounds clear to auscultation       Cardiovascular negative cardio ROS Normal cardiovascular exam Rhythm:Regular Rate:Normal     Neuro/Psych negative neurological ROS  negative psych ROS   GI/Hepatic negative GI ROS, Neg liver ROS,   Endo/Other  Hypothyroidism   Renal/GU negative Renal ROS  negative genitourinary   Musculoskeletal negative musculoskeletal ROS (+)   Abdominal   Peds negative pediatric ROS (+)  Hematology negative hematology ROS (+)   Anesthesia Other Findings   Reproductive/Obstetrics negative OB ROS                             Anesthesia Physical Anesthesia Plan  ASA: II  Anesthesia Plan: General   Post-op Pain Management:    Induction: Intravenous  PONV Risk Score and Plan: 3 and Propofol infusion  Airway Management Planned: Nasal Cannula  Additional Equipment: None  Intra-op Plan:   Post-operative Plan:   Informed Consent: I have reviewed the patients History and Physical, chart, labs and discussed the procedure including the risks, benefits and alternatives for the proposed anesthesia with the patient or authorized representative who has indicated his/her understanding and acceptance.       Plan Discussed with: CRNA, Anesthesiologist and Surgeon  Anesthesia Plan Comments:         Anesthesia Quick Evaluation

## 2020-05-19 NOTE — Transfer of Care (Signed)
Immediate Anesthesia Transfer of Care Note  Patient: Emily Ellison  Procedure(s) Performed: COLONOSCOPY WITH PROPOFOL (N/A )  Patient Location: PACU and Endoscopy Unit  Anesthesia Type:General  Level of Consciousness: awake  Airway & Oxygen Therapy: Patient Spontanous Breathing  Post-op Assessment: Report given to RN  Post vital signs: Reviewed and stable  Last Vitals:  Vitals Value Taken Time  BP 104/71 05/19/20 1232  Temp 36.8 C 05/19/20 1232  Pulse 67 05/19/20 1234  Resp 18 05/19/20 1234  SpO2 100 % 05/19/20 1234  Vitals shown include unvalidated device data.  Last Pain:  Vitals:   05/19/20 1232  TempSrc: Temporal  PainSc: 0-No pain         Complications: No complications documented.

## 2020-05-19 NOTE — Anesthesia Postprocedure Evaluation (Signed)
Anesthesia Post Note  Patient: Emily Ellison  Procedure(s) Performed: COLONOSCOPY WITH PROPOFOL (N/A )  Patient location during evaluation: Endoscopy Anesthesia Type: General Level of consciousness: awake and awake and alert Pain management: pain level controlled Vital Signs Assessment: post-procedure vital signs reviewed and stable Respiratory status: spontaneous breathing and nonlabored ventilation Cardiovascular status: stable and blood pressure returned to baseline Postop Assessment: no headache and no apparent nausea or vomiting Anesthetic complications: no   No complications documented.   Last Vitals:  Vitals:   05/19/20 1242 05/19/20 1252  BP: 114/76 124/80  Pulse: (!) 56 (!) 54  Resp: 14 13  Temp:    SpO2: 100% 100%    Last Pain:  Vitals:   05/19/20 1252  TempSrc:   PainSc: 0-No pain                 Emilio Math

## 2020-05-19 NOTE — H&P (Signed)
Arlyss Repress, MD [address]  [address]  [place], [place] [address]  Main: [phone]  Fax: [phone] Pager: [phone]  Primary Care Physician:  Inc, [facility] Primary Gastroenterologist:  Dr. Arlyss Repress  Pre-Procedure History & Physical: HPI:  Emily Ellison is a 44 y.o. female is here for an colonoscopy.   Past Medical History:  Diagnosis Date  . Hypothyroidism   . Thyroid disease   . 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  Last Assessment & Plan:  Formatting of this note might be different from the original. Seen for genetic counseling 12/13/2013. Opted to have NIPS.    History reviewed. No pertinent surgical history.  Prior to Admission medications   Medication Sig Start Date End Date Taking? Authorizing Provider  levothyroxine (SYNTHROID) 88 MCG tablet Take by mouth. 12/05/12   [provider]    Allergies as of 05/11/2020  . (No Known Allergies)    Family History  Problem Relation Age of Onset  . Hypertension Mother   . Hyperlipidemia Mother   . Ovarian cancer Mother   . Hyperlipidemia Father   . Hypertension Father     Social History   Socioeconomic History  . Marital status: Single    Spouse name: Not on file  . Number of children: Not on file  . Years of education: Not on file  . Highest education level: Not on file  Occupational History  . Not on file  Tobacco Use  . Smoking status: Never Smoker  . Smokeless tobacco: Never Used  Vaping Use  . Vaping Use: Never used  Substance and Sexual Activity  . Alcohol use: Never  . Drug use: Never  . Sexual activity: Not on file  Other Topics Concern  . Not on file  Social History Narrative  . Not on file   Social Determinants of Health   Financial Resource Strain:   . Difficulty of Paying  Living Expenses: Not on file  Food Insecurity:   . Worried About Programme researcher, broadcasting/film/video in the Last Year: Not on file  . Ran Out of Food in the Last Year: Not on file  Transportation Needs:   . Lack of Transportation (Medical): Not on file  . Lack of Transportation (Non-Medical): Not on file  Physical Activity:   . Days of Exercise per Week: Not on file  . Minutes of Exercise per Session: Not on file  Stress:   . Feeling of Stress : Not on file  Social Connections:   . Frequency of Communication with Friends and Family: Not on file  . Frequency of Social Gatherings with Friends and Family: Not on file  . Attends Religious Services: Not on file  . Active Member of Clubs or Organizations: Not on file  . Attends Banker Meetings: Not on file  . Marital Status: Not on file  Intimate Partner Violence:   . Fear of Current or Ex-Partner: Not on file  . Emotionally Abused: Not on file  . Physically Abused: Not on file  . Sexually Abused: Not on file    Review of Systems: See HPI, otherwise negative ROS  Physical Exam: BP (!) 121/104   Pulse 71   Temp 98 F (36.7 C) (Temporal)   Resp 16   Ht 5\' 5"  (1.651 m)   Wt 66.7  kg   LMP 05/01/2020 Comment: Negative Urine preg  SpO2 100%   BMI 24.46 kg/m  General:   Alert,  pleasant and cooperative in NAD Head:  Normocephalic and atraumatic. Neck:  Supple; no masses or thyromegaly. Lungs:  Clear throughout to auscultation.    Heart:  Regular rate and rhythm. Abdomen:  Soft, nontender and nondistended. Normal bowel sounds, without guarding, and without rebound.   Neurologic:  Alert and  oriented x4;  grossly normal neurologically.  Impression/Plan: Emily Ellison is here for an colonoscopy to be performed for history of diverticulitis  Risks, benefits, limitations, and alternatives regarding  colonoscopy have been reviewed with the patient.  Questions have been answered.  All parties agreeable.   Lannette Donath, MD   05/19/2020, 11:33 AM

## 2020-05-20 ENCOUNTER — Encounter: Payer: Self-pay | Admitting: Gastroenterology

## 2021-04-09 ENCOUNTER — Encounter: Payer: Self-pay | Admitting: General Surgery

## 2022-02-19 IMAGING — CT CT ABD-PELV W/ CM
2 of 5 series · 16 of 46 positions shown, 18 images · IV contrast (APPLIED)
Comparison: None.

CLINICAL DATA: Left lower quadrant abdominal pain.

EXAM:
CT ABDOMEN AND PELVIS WITH CONTRAST
TECHNIQUE: Multidetector CT imaging of the abdomen and pelvis was performed
using the standard protocol following bolus administration of
intravenous contrast.
CONTRAST:  100mL OMNIPAQUE IOHEXOL 300 MG/ML  SOLN

[Series 2: routine abd/pel with · axial · 0.72mm/px · z∈[-532,-97]mm · 13 of 99 slices shown, 15 images]
[im 6/99  soft-tissue]
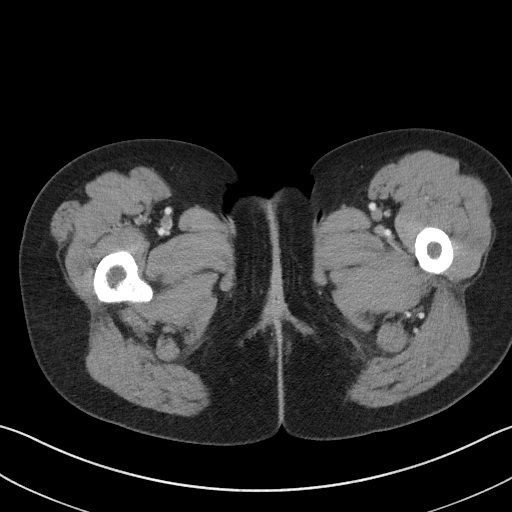
[im 6/99  bone]
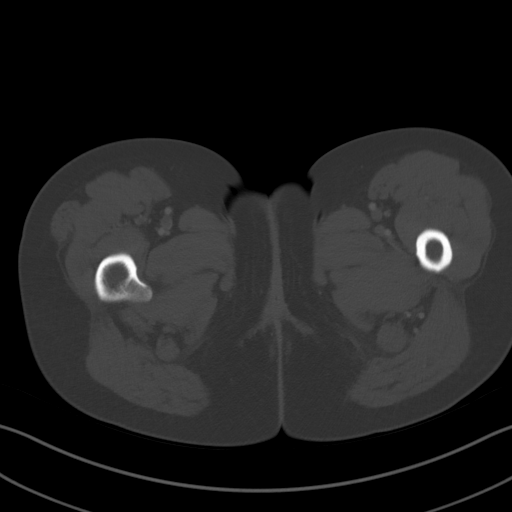
[im 16/99  soft-tissue]
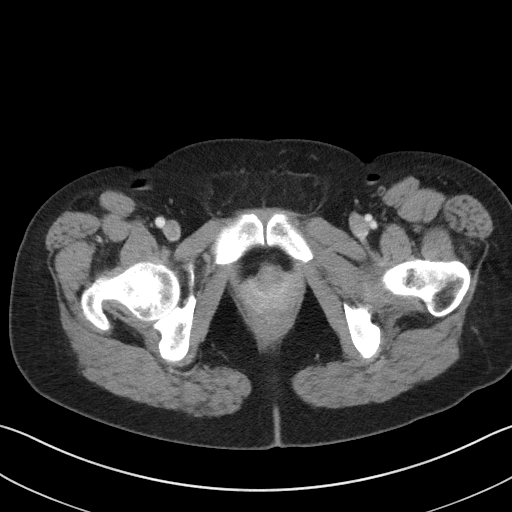
[im 21/99  soft-tissue]
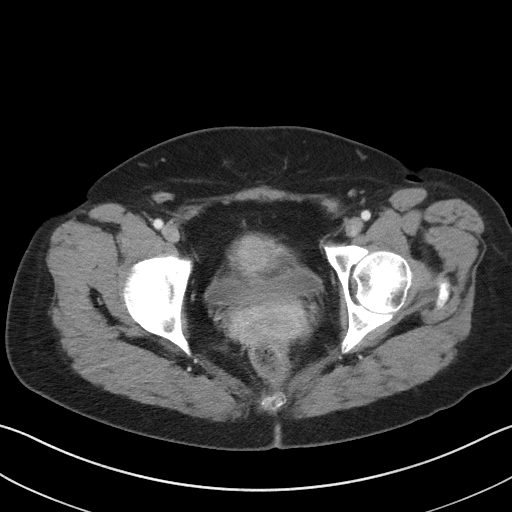
[im 26/99  soft-tissue]
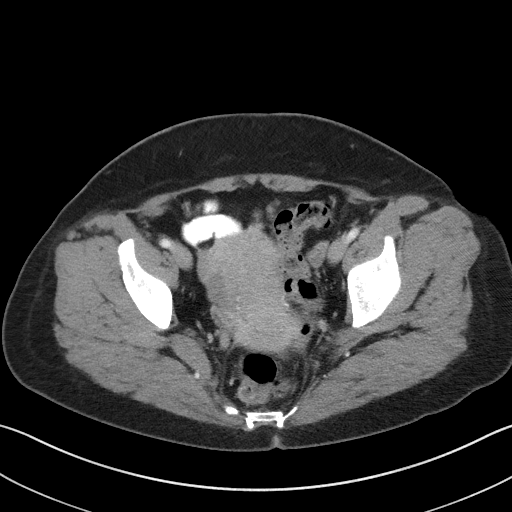
[im 37/99  soft-tissue]
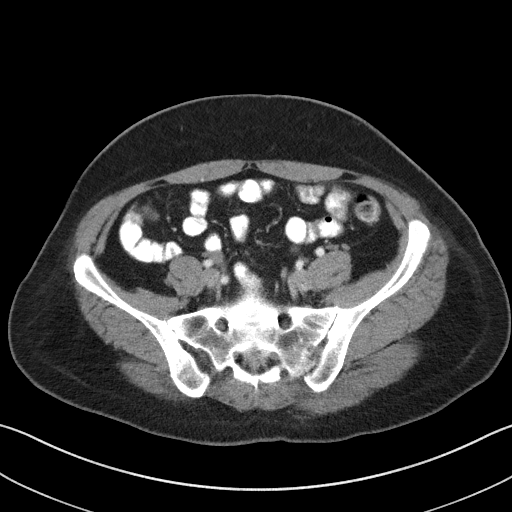
[im 42/99  soft-tissue]
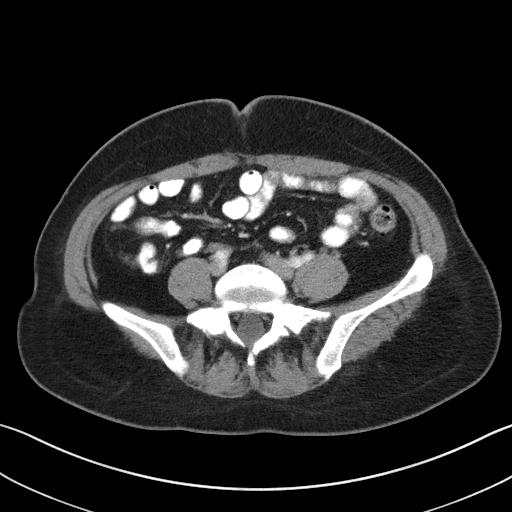
[im 52/99  soft-tissue]
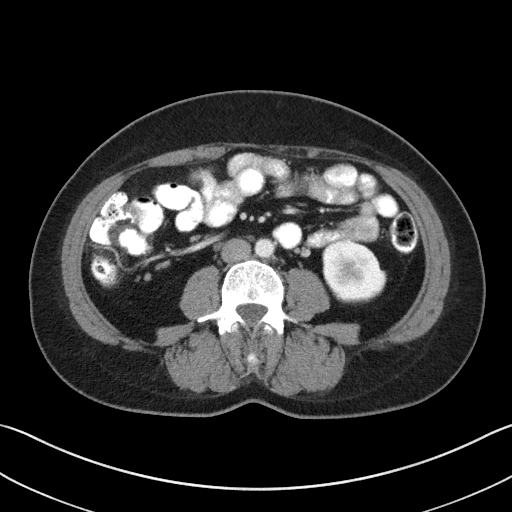
[im 57/99  soft-tissue]
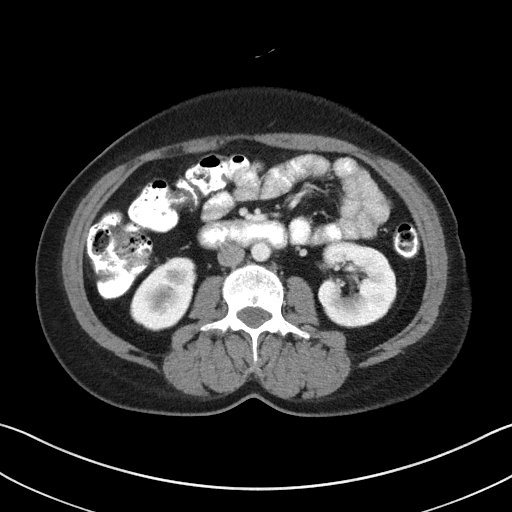
[im 62/99  soft-tissue]
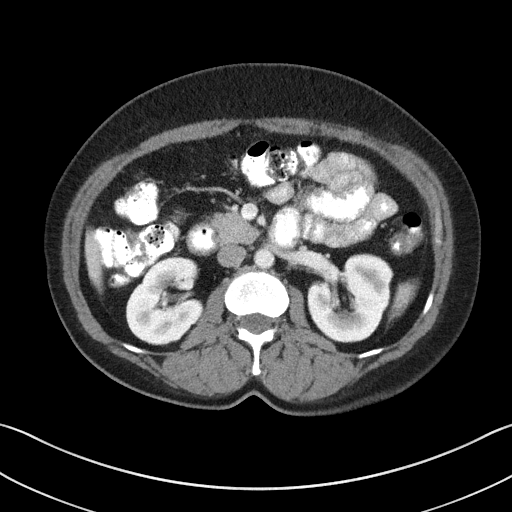
[im 62/99  bone]
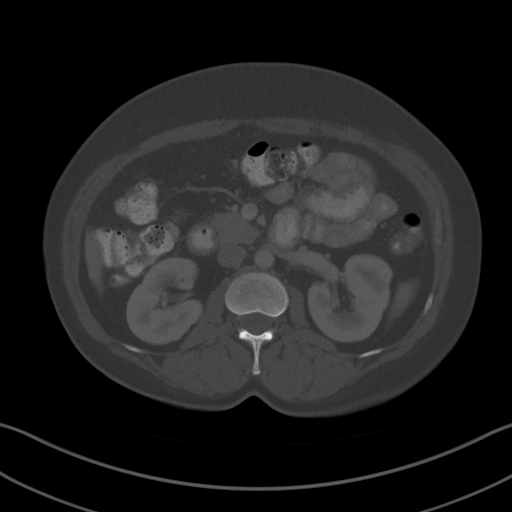
[im 73/99  soft-tissue]
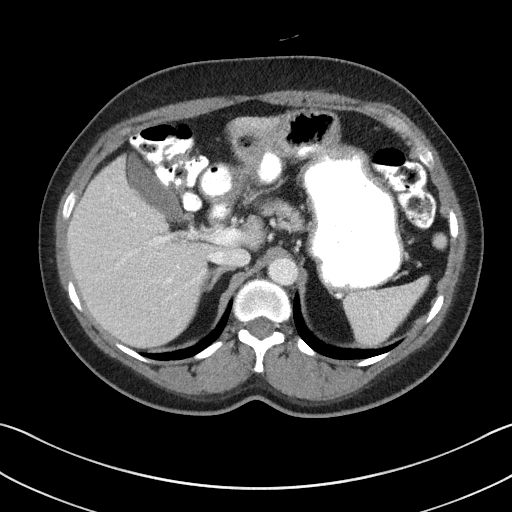
[im 78/99  soft-tissue]
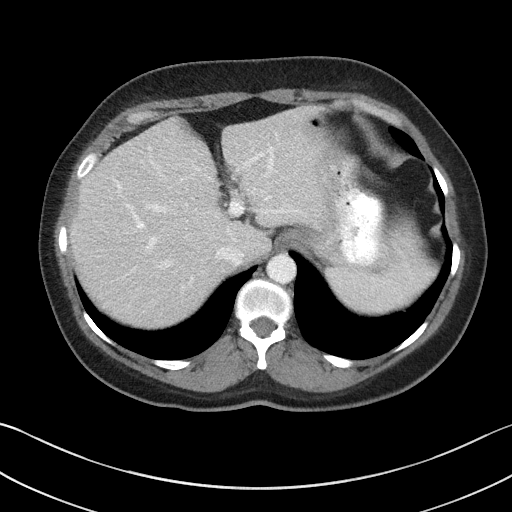
[im 83/99  soft-tissue]
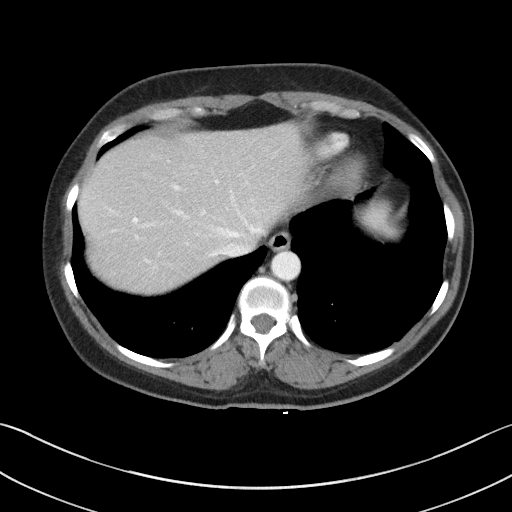
[im 93/99  soft-tissue]
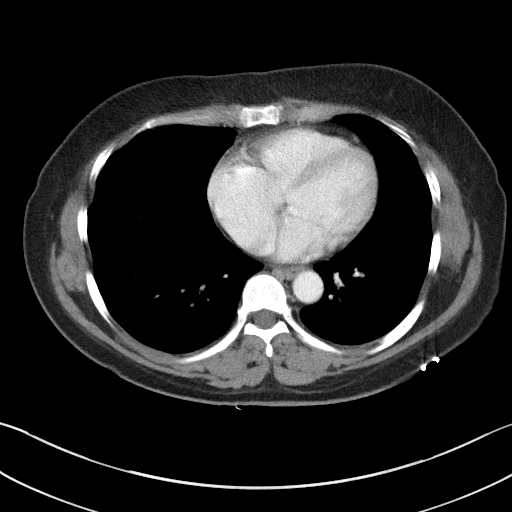

[Series 5: coronal st · coronal · 0.68mm/px · 3 of 84 slices shown]
[im 28/84  soft-tissue]
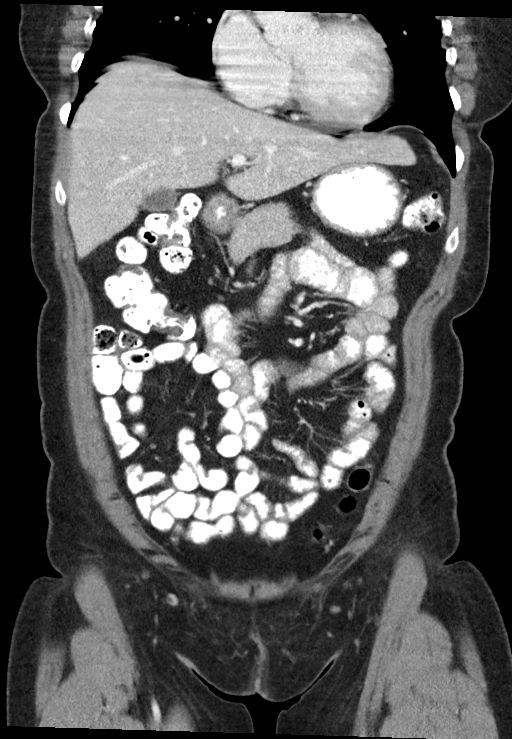
[im 37/84  soft-tissue]
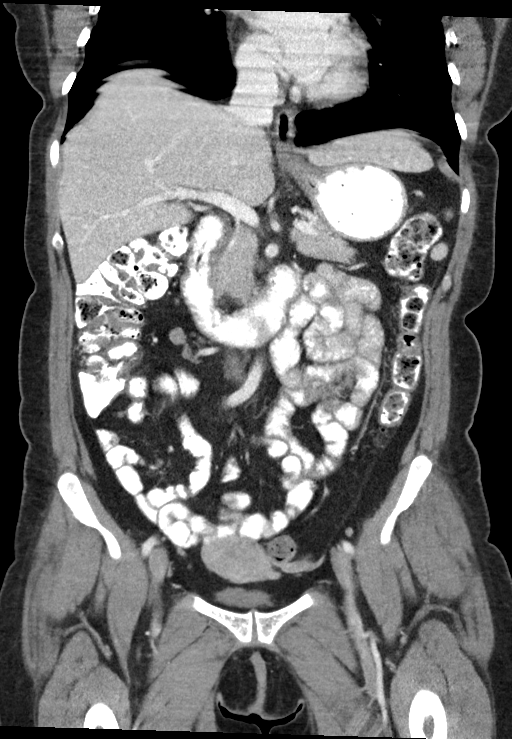
[im 47/84  soft-tissue]
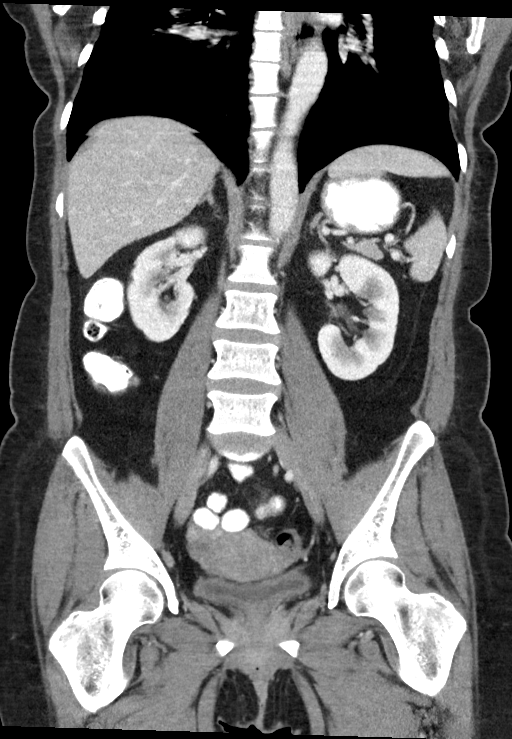

[16 of 46 positions shown; findings below may reference images not displayed]

FINDINGS: Lower chest: No acute abnormality.

Hepatobiliary: No focal liver abnormality is seen. No gallstones,
gallbladder wall thickening, or biliary dilatation.

Pancreas: Unremarkable. No pancreatic ductal dilatation or
surrounding inflammatory changes.

Spleen: Normal in size without focal abnormality.

Adrenals/Urinary Tract: Adrenal glands appear normal. Small
nonobstructive left renal calculus is noted. No hydronephrosis or
renal obstruction is noted. Urinary bladder is decompressed.

Stomach/Bowel: Stomach is within normal limits. Appendix appears
normal. No evidence of bowel wall thickening, distention, or
inflammatory changes.

Vascular/Lymphatic: No significant vascular findings are present. No
enlarged abdominal or pelvic lymph nodes.

Reproductive: Uterus and bilateral adnexa are unremarkable.

Other: No abdominal wall hernia or abnormality. No abdominopelvic
ascites.

Musculoskeletal: No acute or significant osseous findings.
IMPRESSION: 1. Small nonobstructive left renal calculus. No hydronephrosis or
renal obstruction is noted.
2. No other abnormality seen in the abdomen or pelvis.

## 2023-12-08 ENCOUNTER — Encounter: Payer: Self-pay | Admitting: Obstetrics & Gynecology

## 2024-01-24 ENCOUNTER — Ambulatory Visit (INDEPENDENT_AMBULATORY_CARE_PROVIDER_SITE_OTHER): Payer: Self-pay | Admitting: Obstetrics and Gynecology

## 2024-01-24 ENCOUNTER — Encounter: Payer: Self-pay | Admitting: Obstetrics and Gynecology

## 2024-01-24 VITALS — BP 128/83 | HR 63 | Ht 65.0 in | Wt 158.5 lb

## 2024-01-24 DIAGNOSIS — Z124 Encounter for screening for malignant neoplasm of cervix: Secondary | ICD-10-CM

## 2024-01-24 DIAGNOSIS — K625 Hemorrhage of anus and rectum: Secondary | ICD-10-CM

## 2024-01-24 DIAGNOSIS — R102 Pelvic and perineal pain: Secondary | ICD-10-CM

## 2024-01-24 DIAGNOSIS — Z7689 Persons encountering health services in other specified circumstances: Secondary | ICD-10-CM

## 2024-01-24 NOTE — Progress Notes (Signed)
 HPI:      [name] is a 48 y.o. G0P0000 who LMP was Patient's last menstrual period was 01/13/2024 (exact date).  Subjective:   She presents today stating that her main problem today is that she has heavy rectal bleeding.  This has developed in the last several months.  She is very concerned about this rectal bleeding. In addition, she was scheduled for hysterectomy for suspicion of endometriosis pelvic pain heavy bleeding-at [facility].  Her preop was completed but she did not go forth with her surgery because of some type of financial situation.  She states that she continued to experience pelvic pain despite the fact that her bleeding is now minimal.    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 She does not have any pertinent problems on file. She  has a past surgical history that includes Colonoscopy with propofol  (N/A, 05/19/2020). Her family history includes Hyperlipidemia in her father and mother; Hypertension in her father and mother; Ovarian cancer in her mother. She  reports that she has never smoked. She has never used smokeless tobacco. She reports that she does not drink alcohol and does not use drugs. She has a current medication list which includes the following prescription(s): acetaminophen , aviane, cholecalciferol, ibuprofen, levothyroxine, magnesium oxide, omeprazole, and riboflavin. She has no known allergies.       Review of Systems:  Review of Systems  Constitutional: Denied constitutional symptoms, night sweats, recent illness, fatigue, fever, insomnia and weight loss.  Eyes: Denied eye symptoms, eye pain, photophobia, vision change and visual disturbance.  Ears/Nose/Throat/Neck: Denied ear, nose, throat or neck symptoms, hearing loss, nasal discharge, sinus  congestion and sore throat.  Cardiovascular: Denied cardiovascular symptoms, arrhythmia, chest pain/pressure, edema, exercise intolerance, orthopnea and palpitations.  Respiratory: Denied pulmonary symptoms, asthma, pleuritic pain, productive sputum, cough, dyspnea and wheezing.  Gastrointestinal: Denied, gastro-esophageal reflux, melena, nausea and vomiting.  Genitourinary: See HPI for additional information.  Musculoskeletal: Denied musculoskeletal symptoms, stiffness, swelling, muscle weakness and myalgia.  Dermatologic: Denied dermatology symptoms, rash and scar.  Neurologic: Denied neurology symptoms, dizziness, headache, neck pain and syncope.  Psychiatric: Denied psychiatric symptoms, anxiety and depression.  Endocrine: Denied endocrine symptoms including hot flashes and night sweats.   Meds:   Current Outpatient Medications on File Prior to Visit  Medication Sig Dispense Refill   acetaminophen  (TYLENOL ) 325 MG tablet Take by mouth.     AVIANE 0.1-20 MG-MCG tablet Take 1 tablet by mouth daily.     cholecalciferol (VITAMIN D3) 25 MCG (1000 UNIT) tablet Take 1,000 Units by mouth daily.     ibuprofen (ADVIL) 800 MG tablet Take 800 mg by mouth 3 (three) times daily.     levothyroxine (SYNTHROID) 88 MCG tablet Take by mouth.     magnesium oxide (MAG-OX) 400 MG tablet Take 1 tablet by mouth daily.     omeprazole (PRILOSEC) 40 MG capsule Take 40 mg by mouth daily.     Riboflavin 400 MG TABS Take 1 tablet by mouth daily.     No current facility-administered medications on file prior to visit.      Objective:     Vitals:   01/24/24 0825  BP: 128/83  Pulse: 63   Filed Weights   01/24/24 0825  Weight:  158 lb 8 oz (71.9 kg)                        Assessment:    G0P0000 Patient Active Problem List   Diagnosis Date Noted   Diverticulitis large intestine w/o perforation or abscess w/o bleeding 04/23/2020   Spontaneous vaginal delivery 06/21/2014   AMA (advanced maternal  age) multigravida 35+ 12/06/2013   Encounter for genetic counseling 12/06/2013   Hypothyroidism 12/05/2012     1. Establishing care with new doctor, encounter for   2. Pelvic pain   3. Rectal bleeding     Rectal bleeding is her primary concern and I believe that this needs to be worked up first prior to any further discussion regarding pelvic issues.  In addition, her vaginal bleeding issues seem to have resolved.   Plan:            1.  Referral to colorectal surgery for workup of heavy rectal bleeding  2.  Recommend continued follow-up and possible hysterectomy through [facility] as they have done the initial workup and the actual preop appointment. All questions answered.  Rationale for management of rectal bleeding prior to abdominal/laparoscopic hysterectomy options discussed. Orders Orders Placed This Encounter  Procedures   Ambulatory referral to Colorectal Surgery    No orders of the defined types were placed in this encounter.     F/U  No follow-ups on file.  Alm DOROTHA Sar, M.D. 01/24/2024 9:23 AM
